# Patient Record
Sex: Female | Born: 1960 | Hispanic: Refuse to answer | Marital: Single | Smoking: Never smoker
Health system: Southern US, Community
[De-identification: ages and names within clinical notes are randomized; demographics above are authoritative.]

---

## 2018-03-14 ENCOUNTER — Ambulatory Visit (INDEPENDENT_AMBULATORY_CARE_PROVIDER_SITE_OTHER): Payer: Worker's Compensation

## 2018-03-14 ENCOUNTER — Ambulatory Visit (INDEPENDENT_AMBULATORY_CARE_PROVIDER_SITE_OTHER): Payer: Worker's Compensation | Admitting: Nurse Practitioner

## 2018-03-14 ENCOUNTER — Encounter: Payer: Self-pay | Admitting: Nurse Practitioner

## 2018-03-14 VITALS — BP 147/86 | HR 86 | Temp 97.3°F | Ht 64.0 in | Wt 187.0 lb

## 2018-03-14 DIAGNOSIS — M25561 Pain in right knee: Secondary | ICD-10-CM | POA: Diagnosis not present

## 2018-03-14 MED ORDER — NAPROXEN 500 MG PO TABS: 500.0000 mg | ORAL_TABLET | Freq: Two times a day (BID) | ORAL | 1 refills | Status: AC

## 2018-03-14 NOTE — Progress Notes (Signed)
   Subjective:    Patient ID: Megan Bautista, female    DOB: 09/21/1961, 57 y.o.   MRN: [id number]  HPI Megan Bautista comes in today for workers comp injury.  DATE OF INJURY: 03/09/18   EMPLOYER: [employer]  EXPLANATION OF INJURY: Patient says she was lifting a 21 in tire, she had to squat down to lift it up and when she stood up she felt pain in right knee. Has been hurting every since. The next morning the knee was swollen and painful to walk on.She has been taking aleve.     Review of Systems  Constitutional: Negative.   Respiratory: Negative.   Cardiovascular: Negative.   Musculoskeletal: Positive for arthralgias (right knee).  Neurological: Negative.   Psychiatric/Behavioral: Negative.   All other systems reviewed and are negative.      Objective:   Physical Exam  Constitutional: She is oriented to person, place, and time. She appears well-developed and well-nourished. No distress.  Cardiovascular: Normal rate and regular rhythm.  Pulmonary/Chest: Effort normal and breath sounds normal.  Musculoskeletal:  FROM of right knee without pain Mild effusion Crepitus on flexion and extension   Neurological: She is alert and oriented to person, place, and time.  Skin: Skin is warm.  Psychiatric: She has a normal mood and affect. Her behavior is normal. Judgment and thought content normal.   BP (!) 147/86   Pulse 86   Temp (!) 97.3 F (36.3 C) (Oral)   Ht 5\' 4"  (1.626 m)   Wt 187 lb (84.8 kg)   BMI 32.10 kg/m   Right knee xray- mild osteoarthritis-Preliminary reading by Paulene FloorMary Fabio Wah, FNP  [facility]      Assessment & Plan:  1. Acute pain of right knee Ice bid Elevate Wrap in ace wrap or elastic knee brace RTO if no better in 1 week - DG Knee 1-2 Views Right; Future - naproxen (NAPROSYN) 500 MG tablet; Take 1 tablet (500 mg total) by mouth 2 (two) times daily with a meal.  Dispense: 60 tablet; Refill: 1  Mary-Margaret Daphine DeutscherMartin, FNP

## 2018-03-14 NOTE — Patient Instructions (Signed)

## 2018-03-21 ENCOUNTER — Encounter: Payer: Self-pay | Admitting: Nurse Practitioner

## 2018-03-21 ENCOUNTER — Ambulatory Visit (INDEPENDENT_AMBULATORY_CARE_PROVIDER_SITE_OTHER): Payer: Worker's Compensation | Admitting: Nurse Practitioner

## 2018-03-21 VITALS — BP 141/92 | HR 91 | Temp 97.2°F | Ht 64.0 in | Wt 190.0 lb

## 2018-03-21 DIAGNOSIS — M25561 Pain in right knee: Secondary | ICD-10-CM | POA: Diagnosis not present

## 2018-03-21 NOTE — Patient Instructions (Signed)
RICE for Routine Care of Injuries Many injuries can be cared for using rest, ice, compression, and elevation (RICE therapy). Using RICE therapy can help to lessen pain and swelling. It can help your body to heal. Rest Reduce your normal activities and avoid using the injured part of your body. You can go back to your normal activities when you feel okay and your doctor says it is okay. Ice Do not put ice on your bare skin.  Put ice in a plastic bag.  Place a towel between your skin and the bag.  Leave the ice on for 20 minutes, 2-3 times a day.  Do this for as long as told by your doctor. Compression Compression means putting pressure on the injured area. This can be done with an elastic bandage. If an elastic bandage has been applied:  Remove and reapply the bandage every 3-4 hours or as told by your doctor.  Make sure the bandage is not wrapped too tight. Wrap the bandage more loosely if part of your body beyond the bandage is blue, swollen, cold, painful, or loses feeling (numb).  See your doctor if the bandage seems to make your problems worse.  Elevation Elevation means keeping the injured area raised. Raise the injured area above your heart or the center of your chest if you can. When should I get help? You should get help if:  You keep having pain and swelling.  Your symptoms get worse.  Get help right away if: You should get help right away if:  You have sudden bad pain at or below the area of your injury.  You have redness or more swelling around your injury.  You have tingling or numbness at or below the injury that does not go away when you take off the bandage.  This information is not intended to replace advice given to you by your health care provider. Make sure you discuss any questions you have with your health care provider. Document Released: 05/11/2008 Document Revised: 10/20/2016 Document Reviewed: 10/31/2014 Elsevier Interactive Patient Education  2017  [employer]  

## 2018-03-21 NOTE — Progress Notes (Signed)
   Subjective:    Patient ID: Megan Bautista, female    DOB: 07/05/1961, 57 y.o.   MRN: [id number]  HPI  Megan Bautista comes in today for workers comp injury.  DATE OF INJURY: 03/09/18   EMPLOYER: [employer] EXPLANATION OF INJURY: patient in/jured right knee when stoping down to lift a tire. She was seen in office on 03/14/18. Knee xra was normal and she had mild knee effusion with pain on flexion and extension. She was given RICE instructions as well as naprosyn to take BID. Knee was getting a little better. She has been wear brae constantky. She say sthat knee still hurts when she is pushing the gas peddle and when she sleep at night on her sie it feels like "knee is collapsing".    Review of Systems  Respiratory: Negative.   Cardiovascular: Negative.   Musculoskeletal: Positive for arthralgias (right knee).  Neurological: Negative.   Psychiatric/Behavioral: Negative.   All other systems reviewed and are negative.      Objective:   Physical Exam  Constitutional: She is oriented to person, place, and time. She appears well-developed and well-nourished. No distress.  Cardiovascular: Normal rate and regular rhythm.  Pulmonary/Chest: Effort normal and breath sounds normal.  Musculoskeletal:  Mild knee effusion Crepitus on flexion and extension No tenderness including patella tenderness. All ligaments are intact  Neurological: She is alert and oriented to person, place, and time.  Skin: Skin is warm.  Psychiatric: She has a normal mood and affect. Her behavior is normal. Judgment and thought content normal.    BP (!) 141/92   Pulse 91   Temp (!) 97.2 F (36.2 C) (Oral)   Ht 5\' 4"  (1.626 m)   Wt 190 lb (86.2 kg)   BMI 32.61 kg/m       Assessment & Plan:   1. Acute pain of right knee    Continue naprosyn as rx Continue RICE instructions If no better in 2 weeks will do ortho referral  Mary-Margaret Daphine DeutscherMartin, FNP

## 2018-10-28 IMAGING — DX DG KNEE 1-2V*R*
2 series · 2 of 2 positions shown · non-contrast
Comparison: None.

CLINICAL DATA: Acute right knee pain after popping injury on
[REDACTED].

EXAM:
RIGHT KNEE - 1-2 VIEW

[knee ap]
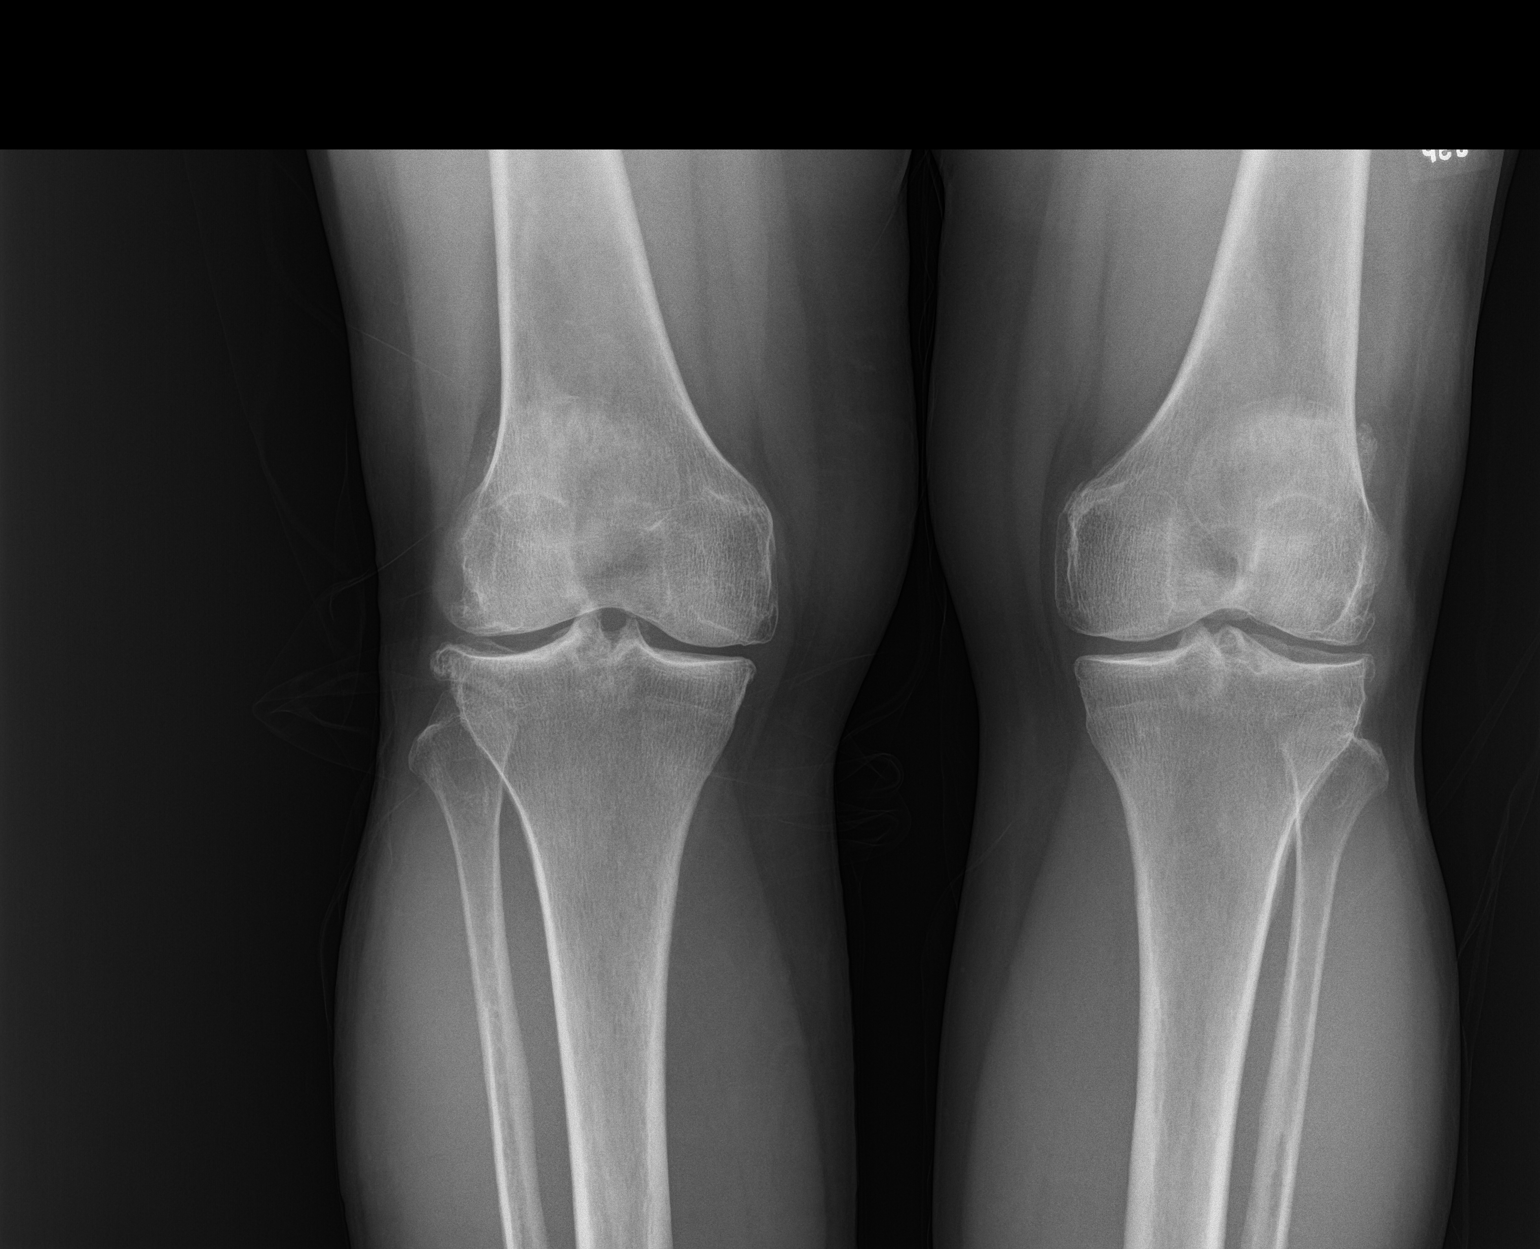

[knee lat]
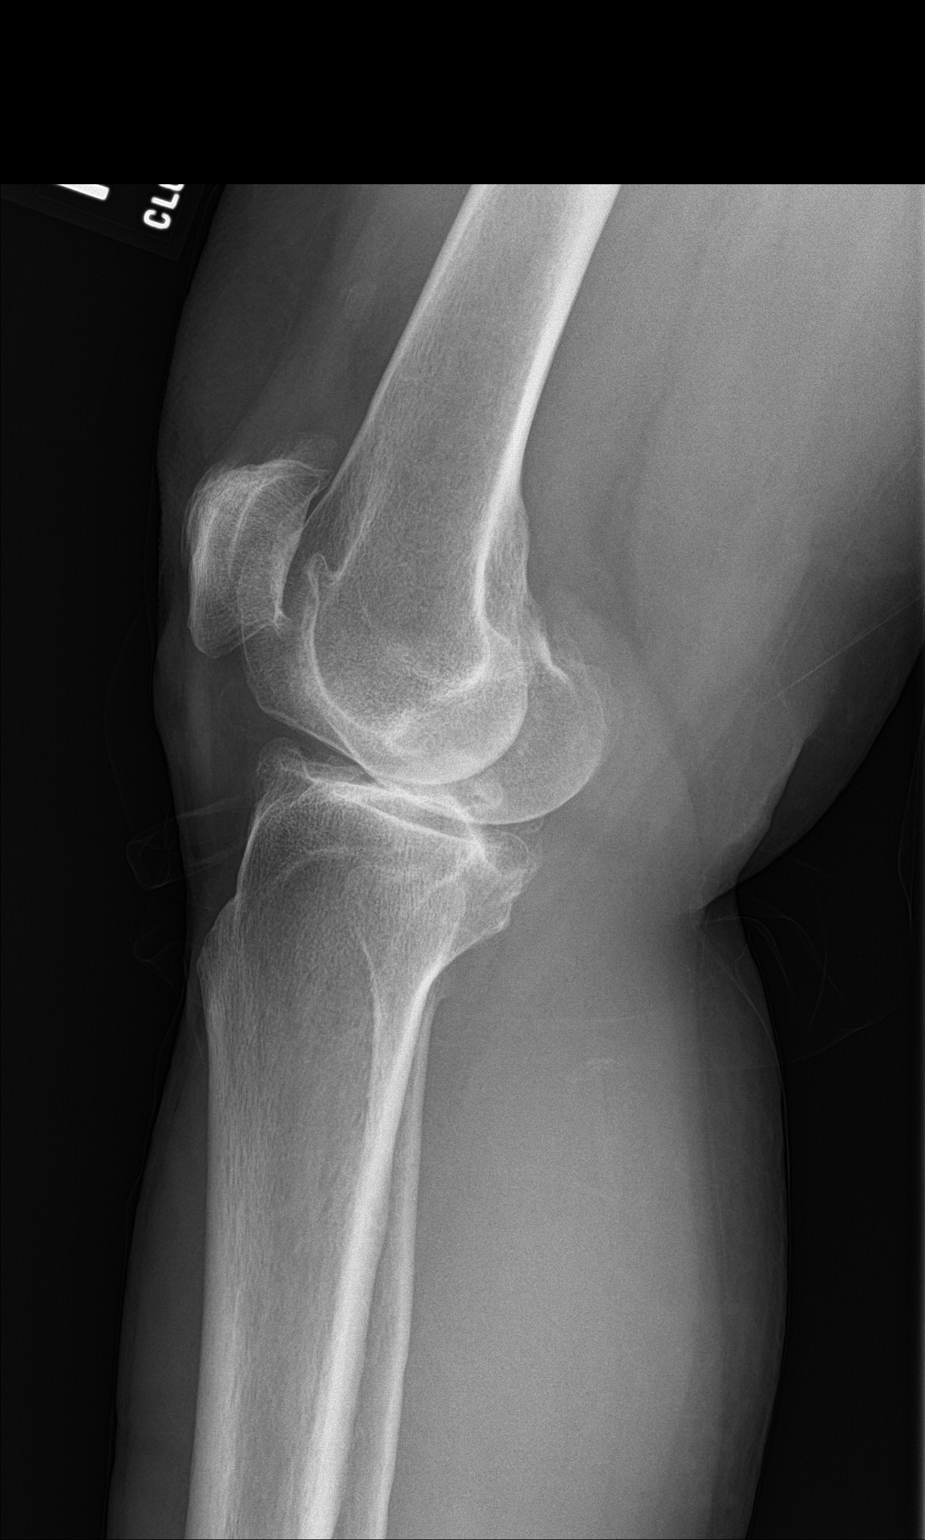

[2 of 2 positions shown; findings below may reference images not displayed]

FINDINGS: Tricompartmental degenerative spurring with medial and lateral
compartment narrowing. Negative for joint effusion or fracture.

Left knee is seen on the standing AP view and also shows
degenerative spurring at the lateral compartment.
IMPRESSION: 1. No acute finding.
2. Tricompartmental osteoarthritis.
